# Patient Record
Sex: Female | Born: 1995 | Marital: Single | Smoking: Current every day smoker
Health system: Southern US, Community
[De-identification: ages and names within clinical notes are randomized; demographics above are authoritative.]

## PROBLEM LIST (undated history)

## (undated) DIAGNOSIS — M25569 Pain in unspecified knee: Secondary | ICD-10-CM

## (undated) DIAGNOSIS — F32A Depression, unspecified: Secondary | ICD-10-CM

## (undated) DIAGNOSIS — Z9889 Other specified postprocedural states: Secondary | ICD-10-CM

## (undated) DIAGNOSIS — F329 Major depressive disorder, single episode, unspecified: Secondary | ICD-10-CM

## (undated) DIAGNOSIS — G8929 Other chronic pain: Secondary | ICD-10-CM

## (undated) DIAGNOSIS — J309 Allergic rhinitis, unspecified: Secondary | ICD-10-CM

## (undated) HISTORY — DX: Major depressive disorder, single episode, unspecified: F32.9

## (undated) HISTORY — DX: Other chronic pain: G89.29

## (undated) HISTORY — DX: Other specified postprocedural states: Z98.890

## (undated) HISTORY — PX: ARTHROSCOPIC REPAIR ACL: SUR80

## (undated) HISTORY — DX: Depression, unspecified: F32.A

## (undated) HISTORY — DX: Allergic rhinitis, unspecified: J30.9

## (undated) HISTORY — DX: Pain in unspecified knee: M25.569

---

## 2014-10-09 ENCOUNTER — Encounter: Payer: Self-pay | Admitting: Medical

## 2014-10-09 ENCOUNTER — Ambulatory Visit (INDEPENDENT_AMBULATORY_CARE_PROVIDER_SITE_OTHER): Payer: BLUE CROSS/BLUE SHIELD | Admitting: Medical

## 2014-10-09 VITALS — BP 110/70 | HR 95 | Temp 98.6°F | Resp 16 | Wt 127.0 lb

## 2014-10-09 DIAGNOSIS — R42 Dizziness and giddiness: Secondary | ICD-10-CM | POA: Diagnosis not present

## 2014-10-09 DIAGNOSIS — R3589 Other polyuria: Secondary | ICD-10-CM

## 2014-10-09 DIAGNOSIS — R634 Abnormal weight loss: Secondary | ICD-10-CM | POA: Diagnosis not present

## 2014-10-09 DIAGNOSIS — Z833 Family history of diabetes mellitus: Secondary | ICD-10-CM

## 2014-10-09 DIAGNOSIS — R358 Other polyuria: Secondary | ICD-10-CM

## 2014-10-09 LAB — BASIC METABOLIC PANEL
BUN: 13 mg/dL (ref 6–23)
CO2: 22 mEq/L (ref 19–32)
Calcium: 9.3 mg/dL (ref 8.4–10.5)
Chloride: 106 mEq/L (ref 96–112)
Creat: 0.69 mg/dL (ref 0.50–1.10)
Glucose, Bld: 100 mg/dL — ABNORMAL HIGH (ref 70–99)
Potassium: 4.2 mEq/L (ref 3.5–5.3)
Sodium: 140 mEq/L (ref 135–145)

## 2014-10-09 LAB — CBC WITH DIFFERENTIAL/PLATELET
BASOS PCT: 0 % (ref 0–1)
Basophils Absolute: 0 10*3/uL (ref 0.0–0.1)
Eosinophils Absolute: 0.5 10*3/uL (ref 0.0–0.7)
Eosinophils Relative: 4 % (ref 0–5)
HCT: 43 % (ref 36.0–46.0)
Hemoglobin: 14.4 g/dL (ref 12.0–15.0)
Lymphocytes Relative: 28 % (ref 12–46)
Lymphs Abs: 3.2 10*3/uL (ref 0.7–4.0)
MCH: 29.8 pg (ref 26.0–34.0)
MCHC: 33.5 g/dL (ref 30.0–36.0)
MCV: 88.8 fL (ref 78.0–100.0)
MPV: 11.2 fL (ref 8.6–12.4)
Monocytes Absolute: 0.7 10*3/uL (ref 0.1–1.0)
Monocytes Relative: 6 % (ref 3–12)
NEUTROS PCT: 62 % (ref 43–77)
Neutro Abs: 7.1 10*3/uL (ref 1.7–7.7)
Platelets: 309 10*3/uL (ref 150–400)
RBC: 4.84 MIL/uL (ref 3.87–5.11)
RDW: 12.2 % (ref 11.5–15.5)
WBC: 11.4 10*3/uL — AB (ref 4.0–10.5)

## 2014-10-09 LAB — POCT URINALYSIS DIPSTICK
BILIRUBIN UA: NEGATIVE
GLUCOSE UA: NEGATIVE
Ketones, UA: NEGATIVE
LEUKOCYTES UA: NEGATIVE
Nitrite, UA: NEGATIVE
Protein, UA: NEGATIVE
RBC UA: NEGATIVE
Spec Grav, UA: 1.02
UROBILINOGEN UA: NEGATIVE
pH, UA: 6.5

## 2014-10-09 LAB — HEMOGLOBIN A1C
HEMOGLOBIN A1C: 5.4 % (ref <5.7)
Mean Plasma Glucose: 108 mg/dL (ref <117)

## 2014-10-09 LAB — TSH: TSH: 0.908 u[IU]/mL (ref 0.350–4.500)

## 2014-10-09 NOTE — Patient Instructions (Signed)
Thank you for giving me the opportunity to serve you today.    Your diagnosis today includes: Encounter Diagnoses  Name Primary?  . Polyuria Yes  . Lightheadedness   . Loss of weight   . Family history of diabetes mellitus in father      Specific recommendations today include:  Use My Fitness Pal or Livestrong App on the smartphone to tract your calories, and give me some feedback in a week  We will call with lab results  Try and drink 64 oz or water or more daily  Each meal pair a carbohydrate source with a protein such as eggs and toast, peanut butter and crackers, vegetables and nuts, etc.    Try eating 5-6 small meals daily instead of 3 meals  Try relying less on Coke as a beverage  Get 7-8 hours of sleep nightly  Keep a symptoms diary  Return pending labs.   I have included other useful information below for your review.   Hypoglycemia Hypoglycemia occurs when the glucose in your blood is too low. Glucose is a type of sugar that is your body's main energy source. Hormones, such as insulin and glucagon, control the level of glucose in the blood. Insulin lowers blood glucose and glucagon increases blood glucose. Having too much insulin in your blood stream, or not eating enough food containing sugar, can result in hypoglycemia. Hypoglycemia can happen to people with or without diabetes. It can develop quickly and can be a medical emergency.  CAUSES   Missing or delaying meals.  Not eating enough carbohydrates at meals.  Taking too much diabetes medicine.  Not timing your oral diabetes medicine or insulin doses with meals, snacks, and exercise.  Nausea and vomiting.  Certain medicines.  Severe illnesses, such as hepatitis, kidney disorders, and certain eating disorders.  Increased activity or exercise without eating something extra or adjusting medicines.  Drinking too much alcohol.  A nerve disorder that affects body functions like your heart rate,  blood pressure, and digestion (autonomic neuropathy).  A condition where the stomach muscles do not function properly (gastroparesis). Therefore, medicines and food may not absorb properly.  Rarely, a tumor of the pancreas can produce too much insulin. SYMPTOMS   Hunger.  Sweating (diaphoresis).  Change in body temperature.  Shakiness.  Headache.  Anxiety.  Lightheadedness.  Irritability.  Difficulty concentrating.  Dry mouth.  Tingling or numbness in the hands or feet.  Restless sleep or sleep disturbances.  Altered speech and coordination.  Change in mental status.  Seizures or prolonged convulsions.  Combativeness.  Drowsiness (lethargic).  Weakness.  Increased heart rate or palpitations.  Confusion.  Pale, gray skin color.  Blurred or double vision.  Fainting. DIAGNOSIS  A physical exam and medical history will be performed. Your caregiver may make a diagnosis based on your symptoms. Blood tests and other lab tests may be performed to confirm a diagnosis. Once the diagnosis is made, your caregiver will see if your signs and symptoms go away once your blood glucose is raised.  TREATMENT  Usually, you can easily treat your hypoglycemia when you notice symptoms.  Check your blood glucose. If it is less than 70 mg/dl, take one of the following:   3-4 glucose tablets.    cup juice.    cup regular soda.   1 cup skim milk.   -1 tube of glucose gel.   5-6 hard candies.   Avoid high-fat drinks or food that may delay a rise in blood  glucose levels.  Do not take more than the recommended amount of sugary foods, drinks, gel, or tablets. Doing so will cause your blood glucose to go too high.   Wait 10-15 minutes and recheck your blood glucose. If it is still less than 70 mg/dl or below your target range, repeat treatment.   Eat a snack if it is more than 1 hour until your next meal.  There may be a time when your blood glucose may go  so low that you are unable to treat yourself at home when you start to notice symptoms. You may need someone to help you. You may even faint or be unable to swallow. If you cannot treat yourself, someone will need to bring you to the hospital.  HOME CARE INSTRUCTIONS  If you have diabetes, follow your diabetes management plan by:  Taking your medicines as directed.  Following your exercise plan.  Following your meal plan. Do not skip meals. Eat on time.  Testing your blood glucose regularly. Check your blood glucose before and after exercise. If you exercise longer or different than usual, be sure to check blood glucose more frequently.  Wearing your medical alert jewelry that says you have diabetes.  Identify the cause of your hypoglycemia. Then, develop ways to prevent the recurrence of hypoglycemia.  Do not take a hot bath or shower right after an insulin shot.  Always carry treatment with you. Glucose tablets are the easiest to carry.  If you are going to drink alcohol, drink it only with meals.  Tell friends or family members ways to keep you safe during a seizure. This may include removing hard or sharp objects from the area or turning you on your side.  Maintain a healthy weight. SEEK MEDICAL CARE IF:   You are having problems keeping your blood glucose in your target range.  You are having frequent episodes of hypoglycemia.  You feel you might be having side effects from your medicines.  You are not sure why your blood glucose is dropping so low.  You notice a change in vision or a new problem with your vision. SEEK IMMEDIATE MEDICAL CARE IF:   Confusion develops.  A change in mental status occurs.  The inability to swallow develops.  Fainting occurs. Document Released: 07/14/2005 Document Revised: 07/19/2013 Document Reviewed: 11/10/2011 [facility] Patient Information 2015 [employer] [place]. This information is not intended to replace advice given to you by your  health care provider. Make sure you discuss any questions you have with your health care provider.

## 2014-10-09 NOTE — Progress Notes (Signed)
Subjective: Here as a new patient, [place] from [place], [place] moved here 7 years ago, went to [employer].  Currently enrolled [facility] [employer] for psychology.   Here for concern of hypoglycemia.  In the past 10mo has had a few different episodes of feeling weak, lightheaded, almost passed out, sweaty, seemingly confused.   This past Thursday had bad episode.  Went into work 2pm, felt this way 7:30pm.  Had eaten 4pm.  Ended up leaving 9pm after sitting and lying for 2 hours.   Felt like room was tilting, felt really drunk, couldn't focus, sweats, cold feeling.    In general doesn't drink a lot of water.  Drinks mostly coke.  Eats mostly carbs and vegetables, not much meat. Eats a lot of eggs and bread.  Had evaluation back in [place] in the fall for similar, no particular cause found.    Today feels back to normal  Diet - eats breakfast daily.  Eats at 8am, 12pm, 5pm. Eggs.   Exercise - walks dog.  Works at [employer].    Review of Systems Constitutional: -fever, -chills, -sweats, +unexpected weight change,-fatigue ENT: -runny nose, -ear pain, -sore throat Cardiology:  -chest pain, -palpitations, -edema Respiratory: -cough, -shortness of breath, -wheezing Gastroenterology: -abdominal pain, -nausea, -vomiting, -diarrhea, -constipation  Hematology: -bleeding or bruising problems Musculoskeletal: -arthralgias, -myalgias, -joint swelling, -back pain Ophthalmology: -vision changes Urology: -dysuria, -difficulty urinating, -hematuria, +urinary frequency, -urgency Neurology: -headache, -weakness, -tingling, -numbness    Objective: BP 110/70 mmHg  Pulse 95  Temp(Src) 98.6 F (37 C) (Oral)  Resp 16  Wt 127 lb (57.607 kg)  General appearence: alert, no distress, WD/WN, lean white female HEENT: normocephalic, sclerae anicteric, PERRLA, EOMi, nares patent, no discharge or erythema, pharynx normal Oral cavity: MMM, no lesions Neck: supple, no lymphadenopathy, no thyromegaly,  no masses Heart: RRR, normal S1, S2, no murmurs Lungs: CTA bilaterally, no wheezes, rhonchi, or rales Abdomen: +bs, soft, non tender, non distended, no masses, no hepatomegaly, no splenomegaly Back: non tender Musculoskeletal: nontender, no swelling, no obvious deformity Extremities: no edema, no cyanosis, no clubbing Pulses: 2+ symmetric, upper and lower extremities, normal cap refill Neurological: alert, oriented x 3, CN2-12 intact, strength normal upper extremities and lower extremities, sensation normal throughout, DTRs 2+ throughout, no cerebellar signs, gait normal Psychiatric: normal affect, behavior normal, pleasant     Assessment: Encounter Diagnoses  Name Primary?  . Polyuria Yes  . Lightheadedness   . Loss of weight   . Family history of diabetes mellitus in father      Plan: discussed concerns, symptoms.  I suspect she is not eating enough calories, but will check labs to rule out other . Discussed possible causes - hypotension, hypoglycemia, anemia, other.   She will begin checking calories with Livestrong App.  Advised 5-6 small meals daily and more water intake instead of Coke for beverages.  Gave counseling on diet and exercise.   F/u pending labs.

## 2014-10-10 LAB — INSULIN, RANDOM: Insulin: 22.5 u[IU]/mL — ABNORMAL HIGH (ref 2.0–19.6)

## 2014-10-10 LAB — C-PEPTIDE: C-Peptide: 4.24 ng/mL — ABNORMAL HIGH (ref 0.80–3.90)

## 2016-12-19 ENCOUNTER — Emergency Department (HOSPITAL_COMMUNITY): Payer: BLUE CROSS/BLUE SHIELD

## 2016-12-19 ENCOUNTER — Encounter (HOSPITAL_COMMUNITY): Payer: Self-pay | Admitting: Emergency Medicine

## 2016-12-19 ENCOUNTER — Emergency Department (HOSPITAL_COMMUNITY)
Admission: EM | Admit: 2016-12-19 | Discharge: 2016-12-19 | Disposition: A | Discharge status: Home / Self Care | Payer: BLUE CROSS/BLUE SHIELD | Attending: Emergency Medicine | Admitting: Emergency Medicine

## 2016-12-19 DIAGNOSIS — F17200 Nicotine dependence, unspecified, uncomplicated: Secondary | ICD-10-CM | POA: Diagnosis not present

## 2016-12-19 DIAGNOSIS — Z79899 Other long term (current) drug therapy: Secondary | ICD-10-CM | POA: Insufficient documentation

## 2016-12-19 DIAGNOSIS — S3992XA Unspecified injury of lower back, initial encounter: Secondary | ICD-10-CM | POA: Diagnosis present

## 2016-12-19 DIAGNOSIS — Y92410 Unspecified street and highway as the place of occurrence of the external cause: Secondary | ICD-10-CM | POA: Insufficient documentation

## 2016-12-19 DIAGNOSIS — T148XXA Other injury of unspecified body region, initial encounter: Secondary | ICD-10-CM | POA: Diagnosis not present

## 2016-12-19 DIAGNOSIS — M545 Low back pain: Secondary | ICD-10-CM | POA: Diagnosis not present

## 2016-12-19 DIAGNOSIS — Y999 Unspecified external cause status: Secondary | ICD-10-CM | POA: Diagnosis not present

## 2016-12-19 DIAGNOSIS — Y9389 Activity, other specified: Secondary | ICD-10-CM | POA: Insufficient documentation

## 2016-12-19 MED ORDER — IBUPROFEN 400 MG PO TABS
400.0000 mg | ORAL_TABLET | Freq: Once | ORAL | Status: AC | PRN
  Administered 2016-12-19: 400 mg via ORAL

## 2016-12-19 MED ORDER — IBUPROFEN 400 MG PO TABS
ORAL_TABLET | ORAL | Status: AC
  Filled 2016-12-19: qty 1

## 2016-12-19 NOTE — ED Notes (Signed)
Patient currently in the shower.
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
